# Patient Record
Sex: Female | Born: 1985 | Race: White | Hispanic: No | Marital: Single
Health system: Southern US, Community
[De-identification: ages and names within clinical notes are randomized; demographics above are authoritative.]

---
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
## 2005-05-16 ENCOUNTER — Ambulatory Visit (HOSPITAL_COMMUNITY): Admission: RE | Admit: 2005-05-16 | Discharge: 2005-05-16 | Payer: Self-pay | Admitting: Gastroenterology

## 2005-05-16 ENCOUNTER — Encounter (INDEPENDENT_AMBULATORY_CARE_PROVIDER_SITE_OTHER): Payer: Self-pay | Admitting: Specialist

## 2005-06-06 ENCOUNTER — Ambulatory Visit (HOSPITAL_COMMUNITY): Admission: RE | Admit: 2005-06-06 | Discharge: 2005-06-06 | Payer: Self-pay | Admitting: Gastroenterology

## 2006-07-01 ENCOUNTER — Other Ambulatory Visit: Admission: RE | Admit: 2006-07-01 | Discharge: 2006-07-01 | Payer: Self-pay | Admitting: Family Medicine

## 2007-04-29 ENCOUNTER — Emergency Department (HOSPITAL_COMMUNITY): Admission: EM | Admit: 2007-04-29 | Discharge: 2007-04-29 | Payer: Self-pay | Admitting: Emergency Medicine

## 2008-08-26 ENCOUNTER — Other Ambulatory Visit: Admission: RE | Admit: 2008-08-26 | Discharge: 2008-08-26 | Payer: Self-pay | Admitting: Family Medicine

## 2010-09-07 NOTE — Op Note (Signed)
[name], Sonya Wagner                 ACCOUNT NO.:  [id number]   MEDICAL RECORD NO.:  [id number]          PATIENT TYPE:  AMB   LOCATION:  ENDO                         FACILITY:  [facility]   PHYSICIAN:  Bernette Redbird, M.D.   DATE OF BIRTH:  June 26, 1985   DATE OF PROCEDURE:  DATE OF DISCHARGE:                                 OPERATIVE REPORT   INAUDIBLE DICTATION.           ______________________________  Bernette Redbird, M.D.     RB/MEDQ  D:  05/16/2005  T:  05/16/2005  Job:  [id number]

## 2010-09-07 NOTE — Op Note (Signed)
Sonya Wagner, Sonya Wagner                 ACCOUNT NO.:  [id number]   MEDICAL RECORD NO.:  [id number]          PATIENT TYPE:  AMB   LOCATION:  ENDO                         FACILITY:  [facility]   PHYSICIAN:  Bernette Redbird, M.D.   DATE OF BIRTH:  July 20, 1985   DATE OF PROCEDURE:  05/16/2005  DATE OF DISCHARGE:  05/16/2005                                 OPERATIVE REPORT   PROCEDURE:  Upper endoscopy with biopsies.   INDICATION:  25 year old female with recurrent nausea and vomiting.   FINDINGS:  Normal except for slight bile reflux.   PROCEDURE:  The nature, purpose, risks of the procedure had been discussed  with the patient and her parent and she provided written consent. Sedation  was fentanyl 70 mcg and Versed 7 milligrams IV without arrhythmias or  desaturation.   The Olympus video endoscope was passed under direct vision. The vocal cords  and larynx were unremarkable. The esophagus was readily entered and was  normal in its entirety without evidence of reflux esophagitis or any acid  burns from her recurrent vomiting. There was no evidence of reflux  esophagitis, Barrett's esophagus, varices, infection, neoplasia or any ring  stricture or significant hiatal hernia.   The stomach was entered. It contained a small to moderate bilious residual.  However, the gastric mucosa did not appear significantly erythematous to  suggest bile reflux gastritis. No focal lesion such as erosions, ulcers,  polyps or masses were seen and a retroflexed view of the cardia was  unremarkable.   The pylorus was patent, without scarring or stenosis or hypertrophy. The  duodenal bulb and second duodenum looked normal. Duodenal biopsies were  obtained to help exclude Giardia, celiac disease, etc. and gastric antral  biopsies were obtained to look for evidence of H pylori infection. The scope  was then removed from the patient, who tolerated the procedure well without  apparent complication.   IMPRESSIO:  1.   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 Otherwise,      normal exam. No definite cause for nausea, vomiting identified (787.01).   PLAN:  Await pathology results with follow-up in the office in the near  future.   Note this is a repeat dictation. The original dictation was inaudible and  could not be transcribed.   Signed Maryella Shivers to the MD thank you           ______________________________  Bernette Redbird, M.D.     RB/MEDQ  D:  06/04/2005  T:  06/05/2005  Job:  [id number]   cc:   Royetta Crochet, MD  Fax: [phone]

## 2013-08-18 ENCOUNTER — Telehealth: Payer: Self-pay | Admitting: *Deleted

## 2013-08-18 DIAGNOSIS — G40309 Generalized idiopathic epilepsy and epileptic syndromes, not intractable, without status epilepticus: Secondary | ICD-10-CM

## 2013-08-18 NOTE — Telephone Encounter (Signed)
Pt's mother Corrie DandyMary calling requesting a referral from Dr. Anne HahnWillis to a Neurologist in [place] for the pt. Pt has not been seen in our office since 01/29/07. Mother would like Dr. Anne HahnWillis to give her a call back please. Thanks

## 2013-08-18 NOTE — Telephone Encounter (Signed)
I called the patient, talked with the mother. The patient is in [place], needs a referral to a doctor there. She has a history of seizures, came off of her medications for 7 years without any problems, had a recent seizure. She now has headache, blurred vision. They have requested a referral to Dr. Levora Dredgehomas Mattio in [place], [place]. Telephone number at the office is [phone]. The patient was last seen through this office in July of 2008. Medical records are in [facility].

## 2013-08-18 NOTE — Telephone Encounter (Signed)
Mother requesting a referral from Dr Anne HahnWillis to a Neurologist in [place] for Daughter .  Daughter experiencing grandma seizure and would like to have referral asap. Please call and advise.  Thanks
